# Patient Record
Sex: Male | Born: 1985 | Race: Black or African American | Hispanic: No | Marital: Single | Smoking: Never smoker
Health system: Southern US, Community
[De-identification: ages and names within clinical notes are randomized; demographics above are authoritative.]

## PROBLEM LIST (undated history)

## (undated) DIAGNOSIS — R011 Cardiac murmur, unspecified: Secondary | ICD-10-CM

## (undated) HISTORY — PX: PATENT FORAMEN OVALE CLOSURE: SHX2181

## (undated) HISTORY — PX: APPENDECTOMY: SHX54

---

## 2008-10-26 ENCOUNTER — Emergency Department (HOSPITAL_BASED_OUTPATIENT_CLINIC_OR_DEPARTMENT_OTHER): Admission: EM | Admit: 2008-10-26 | Discharge: 2008-10-26 | Payer: Self-pay | Admitting: Emergency Medicine

## 2008-11-03 ENCOUNTER — Emergency Department (HOSPITAL_BASED_OUTPATIENT_CLINIC_OR_DEPARTMENT_OTHER): Admission: EM | Admit: 2008-11-03 | Discharge: 2008-11-04 | Payer: Self-pay | Admitting: Emergency Medicine

## 2008-11-04 ENCOUNTER — Ambulatory Visit: Payer: Self-pay | Admitting: Diagnostic Radiology

## 2008-11-12 ENCOUNTER — Emergency Department (HOSPITAL_BASED_OUTPATIENT_CLINIC_OR_DEPARTMENT_OTHER): Admission: EM | Admit: 2008-11-12 | Discharge: 2008-11-13 | Payer: Self-pay | Admitting: Emergency Medicine

## 2010-03-06 ENCOUNTER — Emergency Department (HOSPITAL_BASED_OUTPATIENT_CLINIC_OR_DEPARTMENT_OTHER): Admission: EM | Admit: 2010-03-06 | Discharge: 2010-03-06 | Payer: Self-pay | Admitting: Emergency Medicine

## 2010-08-25 LAB — COMPREHENSIVE METABOLIC PANEL
ALT: 18 U/L (ref 0–53)
Alkaline Phosphatase: 135 U/L — ABNORMAL HIGH (ref 39–117)
BUN: 20 mg/dL (ref 6–23)
CO2: 32 mEq/L (ref 19–32)
Calcium: 9.9 mg/dL (ref 8.4–10.5)
GFR calc non Af Amer: >60 mL/min (ref >60)
Glucose, Bld: 98 mg/dL (ref 70–99)
Sodium: 144 mEq/L (ref 135–145)

## 2010-08-25 LAB — LIPASE, BLOOD: Lipase: 83 U/L (ref 23–300)

## 2010-08-25 LAB — CBC
HCT: 49.3 % (ref 39.0–52.0)
Hemoglobin: 16 g/dL (ref 13.0–17.0)
MCHC: 32.5 g/dL (ref 30.0–36.0)
MCV: 87 fL (ref 78.0–100.0)
RBC: 5.67 MIL/uL (ref 4.22–5.81)

## 2010-08-25 LAB — DIFFERENTIAL
Basophils Relative: 1 % (ref 0–1)
Eosinophils Absolute: 0.1 10*3/uL (ref 0.0–0.7)
Lymphs Abs: 2.6 10*3/uL (ref 0.7–4.0)
Neutro Abs: 4.5 10*3/uL (ref 1.7–7.7)
Neutrophils Relative %: 55 % (ref 43–77)

## 2010-08-25 LAB — URINALYSIS, ROUTINE W REFLEX MICROSCOPIC
Bilirubin Urine: NEGATIVE
Glucose, UA: NEGATIVE mg/dL
Hgb urine dipstick: NEGATIVE
Ketones, ur: NEGATIVE mg/dL
Nitrite: NEGATIVE
Specific Gravity, Urine: 1.029 (ref 1.005–1.030)
pH: 7 (ref 5.0–8.0)

## 2010-08-25 LAB — RAPID STREP SCREEN (MED CTR MEBANE ONLY): Streptococcus, Group A Screen (Direct): NEGATIVE

## 2010-10-04 IMAGING — CR DG CHEST 2V
2 series · 2 of 2 positions shown · non-contrast
Comparison: None

CLINICAL DATA: Cough and shortness of breath.

CHEST - 2 VIEW

[w chest pa]
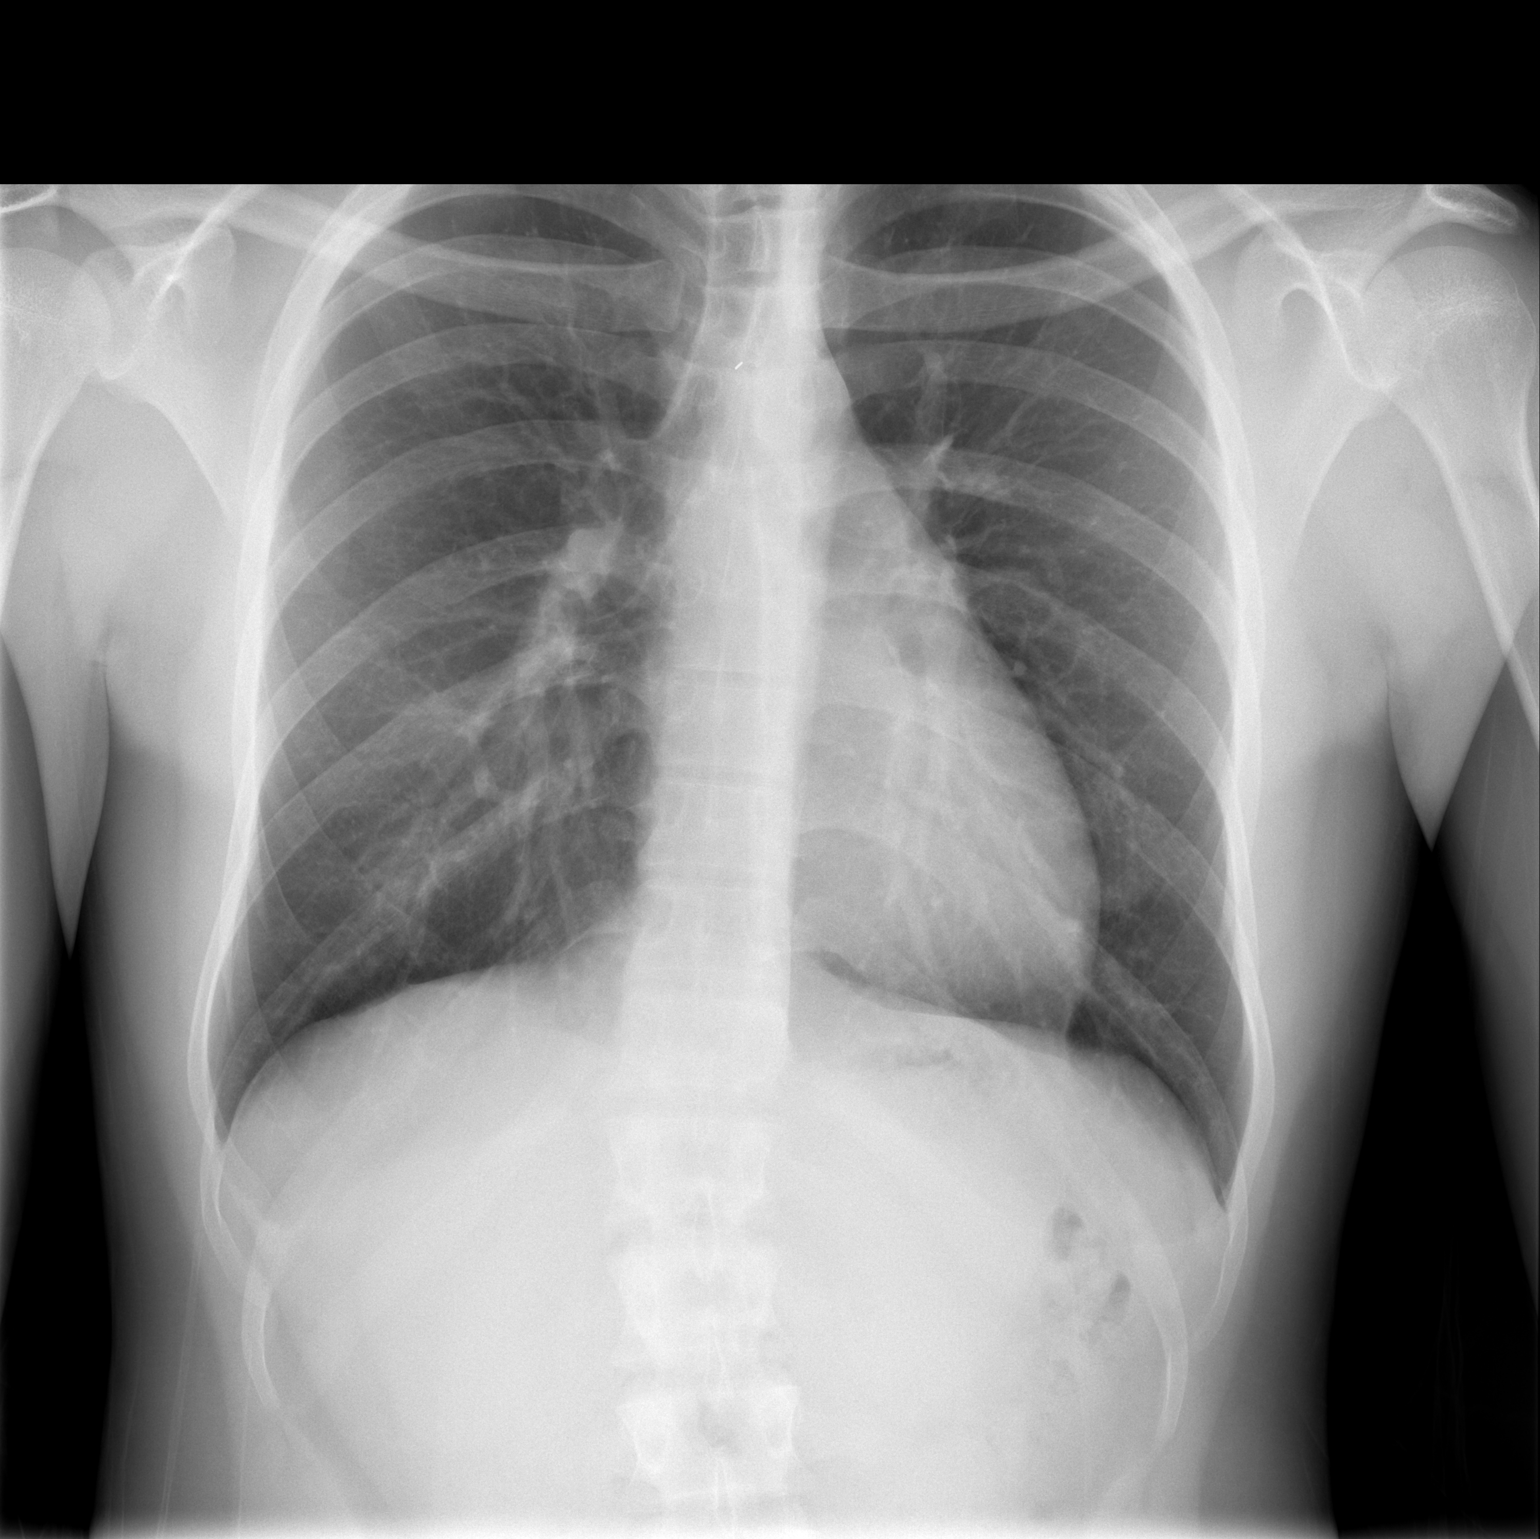

[w chest lat]
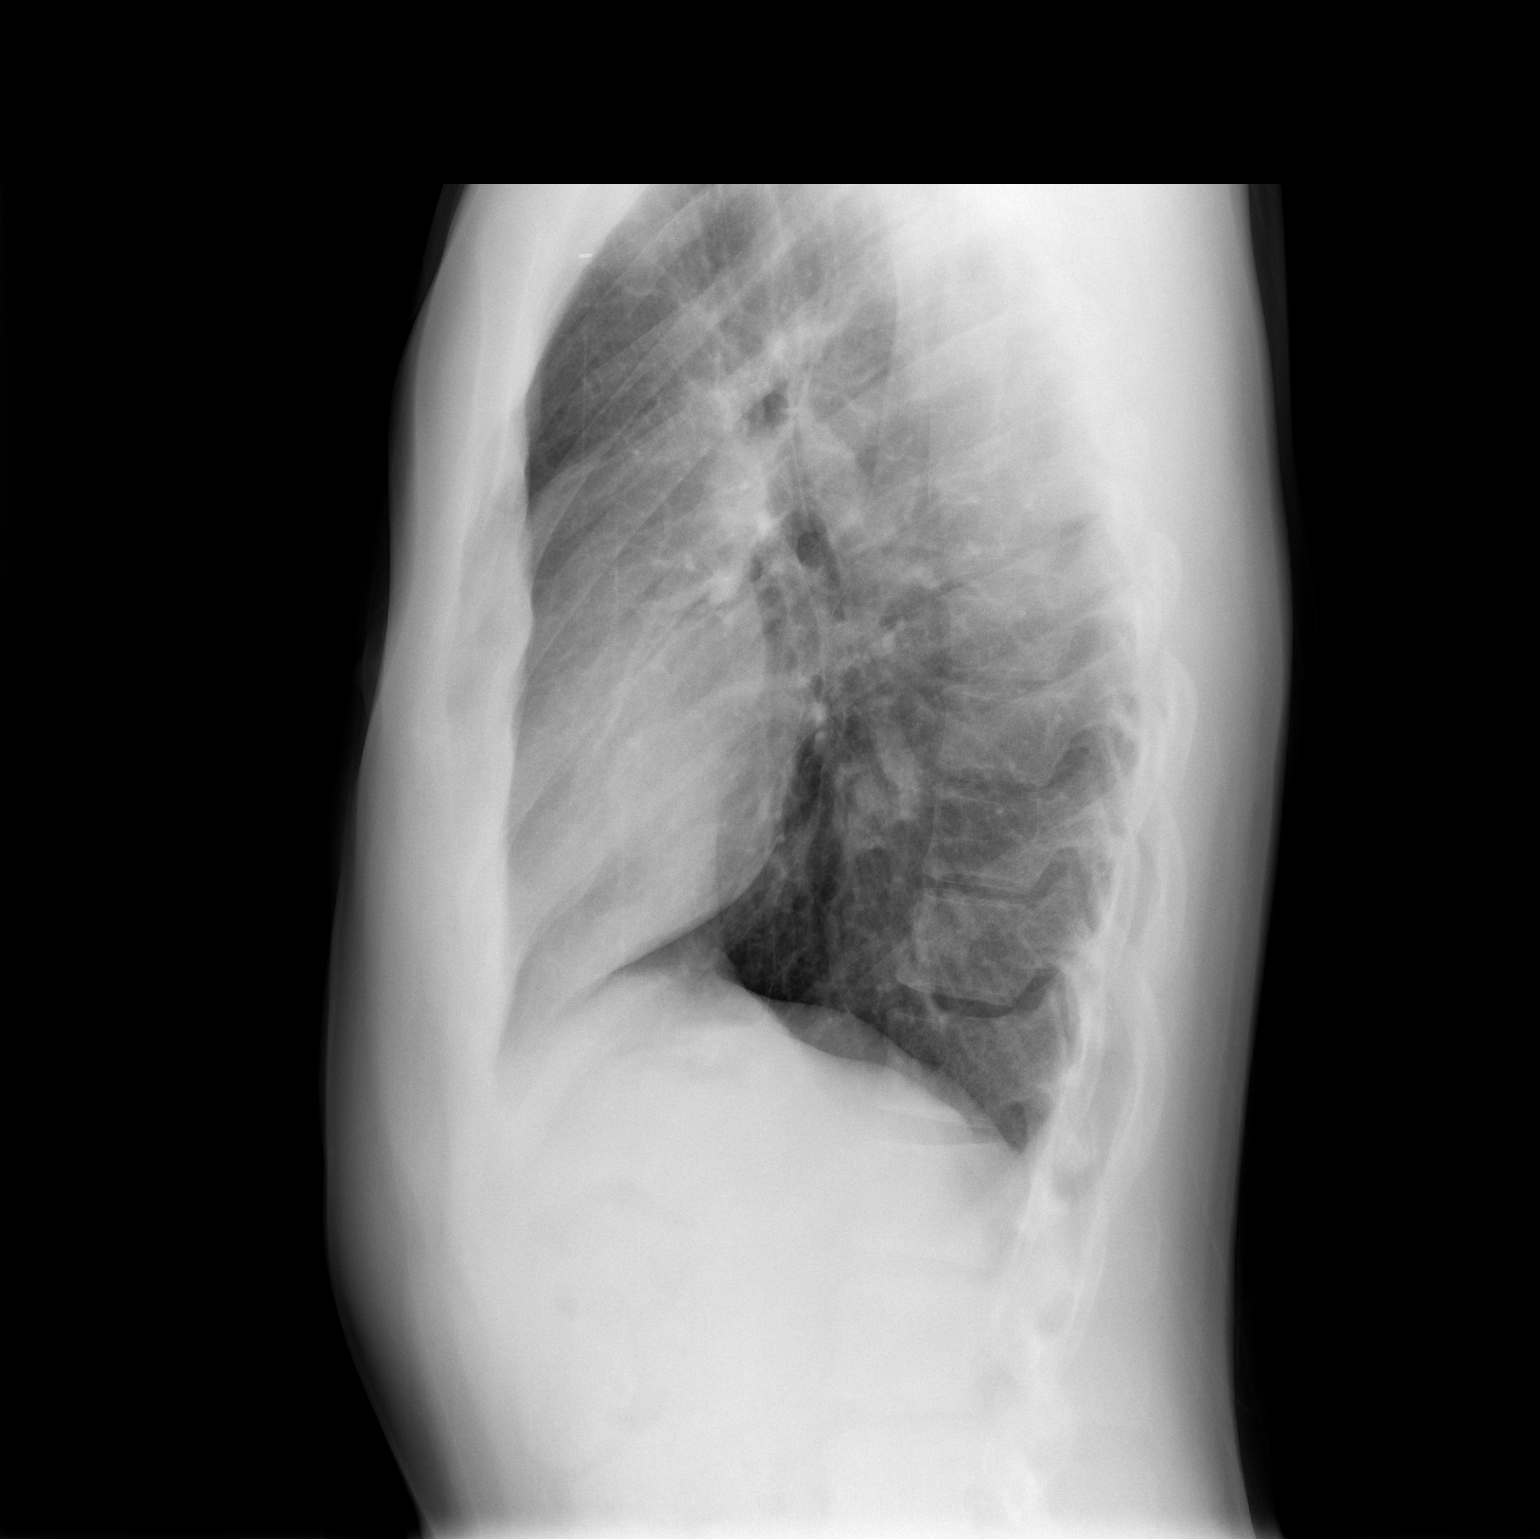

[2 of 2 positions shown; findings below may reference images not displayed]

FINDINGS: The heart size and vascularity are normal and the lungs
are clear.  There is a mild lumbar scoliosis.  There is a tiny
surgical clip seen at the top of the manubrium in the midline.
IMPRESSION: No acute disease.

## 2016-09-23 ENCOUNTER — Emergency Department (HOSPITAL_BASED_OUTPATIENT_CLINIC_OR_DEPARTMENT_OTHER)
Admission: EM | Admit: 2016-09-23 | Discharge: 2016-09-23 | Disposition: A | Discharge status: Home / Self Care | Payer: Self-pay | Attending: Emergency Medicine | Admitting: Emergency Medicine

## 2016-09-23 ENCOUNTER — Encounter (HOSPITAL_BASED_OUTPATIENT_CLINIC_OR_DEPARTMENT_OTHER): Payer: Self-pay

## 2016-09-23 ENCOUNTER — Emergency Department (HOSPITAL_BASED_OUTPATIENT_CLINIC_OR_DEPARTMENT_OTHER): Payer: Self-pay

## 2016-09-23 DIAGNOSIS — R0789 Other chest pain: Secondary | ICD-10-CM | POA: Insufficient documentation

## 2016-09-23 HISTORY — DX: Cardiac murmur, unspecified: R01.1

## 2016-09-23 NOTE — ED Provider Notes (Signed)
[facility] Provider Note   CSN: [id number] Arrival date & time: 09/23/16  1144     History   Chief Complaint Chief Complaint  Patient presents with  . Chest Pain    HPI Patrick Mayo is a 31 y.o. male.  31 yo M with a chief complaint of right-sided chest pain. Worse with movement palpation deep breathing. He denies any overt injury. Denies shortness of breath with it. Denies lower extremity edema hemoptysis testosterone use. Denies history of PE or DVT. Denies recent surgery or prolonged travel. Denies history of smoking. Denies hypertension hyperlipidemia diabetes or family history of heart disease.   The history is provided by the patient.  Chest Pain   This is a new problem. The current episode started more than 2 days ago. The problem occurs rarely. The problem has not changed since onset.The pain is associated with movement. The pain is present in the lateral region. The pain is at a severity of 6/10. The pain is moderate. The quality of the pain is described as brief. The pain does not radiate. Duration of episode(s) is 1 minute. Pertinent negatives include no abdominal pain, no fever, no headaches, no palpitations, no shortness of breath and no vomiting. He has tried nothing for the symptoms. The treatment provided no relief.    Past Medical History:  Diagnosis Date  . Heart murmur     There are no active problems to display for this patient.   Past Surgical History:  Procedure Laterality Date  . APPENDECTOMY         Home Medications    Prior to Admission medications   Not on File    Family History No family history on file.  Social History Social History  Substance Use Topics  . Smoking status: Never Smoker  . Smokeless tobacco: Never Used  . Alcohol use No     Allergies   Patient has no allergy information on record.   Review of Systems Review of Systems  Constitutional: Negative for chills and fever.  HENT: Negative for  congestion and facial swelling.   Eyes: Negative for discharge and visual disturbance.  Respiratory: Negative for shortness of breath.   Cardiovascular: Positive for chest pain. Negative for palpitations.  Gastrointestinal: Negative for abdominal pain, diarrhea and vomiting.  Musculoskeletal: Negative for arthralgias and myalgias.  Skin: Negative for color change and rash.  Neurological: Negative for tremors, syncope and headaches.       Paresthesias  Psychiatric/Behavioral: Negative for confusion and dysphoric mood.     Physical Exam Updated Vital Signs BP 123/65 (BP Location: Left Arm)   Pulse 76   Temp 98.9 F (37.2 C) (Oral)   Resp 18   Ht 6\' 2"  (1.88 m)   Wt 205 lb (93 kg)   SpO2 100%   BMI 26.32 kg/m   Physical Exam  Constitutional: He is oriented to person, place, and time. He appears well-developed and well-nourished.  HENT:  Head: Normocephalic and atraumatic.  Eyes: EOM are normal. Pupils are equal, round, and reactive to light.  Neck: Normal range of motion. Neck supple. No JVD present.  Cardiovascular: Normal rate and regular rhythm.  Exam reveals no gallop and no friction rub.   No murmur heard. Pulmonary/Chest: No respiratory distress. He has no wheezes. He exhibits tenderness (pinpoint chest wall tenderness).  Palpation along the midclavicular line about ribs 6 and 7  Abdominal: He exhibits no distension and no mass. There is no tenderness. There is no rebound and  no guarding.  Musculoskeletal: Normal range of motion.  Clubbing  Neurological: He is alert and oriented to person, place, and time.  Skin: No rash noted. No pallor.  Psychiatric: He has a normal mood and affect. His behavior is normal.  Nursing note and vitals reviewed.    ED Treatments / Results  Labs (all labs ordered are listed, but only abnormal results are displayed) Labs Reviewed - No data to display  EKG  EKG Interpretation  Date/Time:  Wednesday Sep 23 2016 11:56:12  EDT Ventricular Rate:  72 PR Interval:  164 QRS Duration: 94 QT Interval:  392 QTC Calculation: 429 R Axis:   5 Text Interpretation:  Normal sinus rhythm T wave abnormality, consider lateral ischemia Abnormal ECG No old tracing to compare Confirmed by Vauda Salvucci MD, DANIEL [phone]) on 09/23/2016 12:11:40 PM       Radiology Dg Chest 2 View  Result Date: 09/23/2016 CLINICAL DATA:  Chest pain EXAM: CHEST  2 VIEW COMPARISON:  11/04/2008 FINDINGS: Normal heart size and mediastinal contours. Stable clip like density overlapping the central chest. No acute infiltrate or edema. No effusion or pneumothorax. Probable pectus excavatum. No acute osseous finding. IMPRESSION: No evidence of active disease. Electronically Signed   By: Marnee Spring M.D.   On: 09/23/2016 12:12    Procedures Procedures (including critical care time)  Medications Ordered in ED Medications - No data to display   Initial Impression / Assessment and Plan / ED Course  I have reviewed the triage vital signs and the nursing notes.  Pertinent labs & imaging results that were available during my care of the patient were reviewed by me and considered in my medical decision making (see chart for details).     31 yo M With a chief complaint of chest pain. Going on for the past week or so. Reproduced on exam with palpation. Patient is able to point to his pain with one finger. Chest x-ray is unremarkable. Patient has a non-specific EKG. There is no old compare. As the patient's history is so atypical I feel that no further workup is necessary. 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 There is a similar finger change with his brother. I'm unsure if whether this is genetic or not. Suggest a follow-up with family physician.  1:16 PM:  I have discussed the diagnosis/risks/treatment options with the patient and family and believe the pt to be eligible for discharge home to follow-up with PCP. We also  discussed returning to the ED immediately if new or worsening sx occur. We discussed the sx which are most concerning (e.g., sudden worsening pain, fever, inability to tolerate by mouth) that necessitate immediate return. Medications administered to the patient during their visit and any new prescriptions provided to the patient are listed below.  Medications given during this visit Medications - No data to display   The patient appears reasonably screen and/or stabilized for discharge and I doubt any other medical condition or other [facility] requiring further screening, evaluation, or treatment in the ED at this time prior to discharge.    Final Clinical Impressions(s) / ED Diagnoses   Final diagnoses:  Chest wall pain    New Prescriptions New Prescriptions   No medications on file     Melene Plan, DO 09/23/16 1316

## 2016-09-23 NOTE — Discharge Instructions (Signed)
Take 4 over the counter ibuprofen tablets 3 times a day or 2 over-the-counter naproxen tablets twice a day for pain. Also take tylenol 1000mg(2 extra strength) four times a day.    

## 2016-09-23 NOTE — ED Triage Notes (Signed)
c/o intermittent CP x 1-2 weeks-NAD-steady gait

## 2016-09-23 NOTE — ED Notes (Signed)
ED Provider at bedside. 

## 2017-02-01 ENCOUNTER — Emergency Department (HOSPITAL_BASED_OUTPATIENT_CLINIC_OR_DEPARTMENT_OTHER): Payer: Self-pay

## 2017-02-01 ENCOUNTER — Emergency Department (HOSPITAL_BASED_OUTPATIENT_CLINIC_OR_DEPARTMENT_OTHER)
Admission: EM | Admit: 2017-02-01 | Discharge: 2017-02-01 | Disposition: A | Discharge status: Home / Self Care | Payer: Self-pay | Attending: Emergency Medicine | Admitting: Emergency Medicine

## 2017-02-01 ENCOUNTER — Encounter (HOSPITAL_BASED_OUTPATIENT_CLINIC_OR_DEPARTMENT_OTHER): Payer: Self-pay | Admitting: Emergency Medicine

## 2017-02-01 DIAGNOSIS — Z91010 Allergy to peanuts: Secondary | ICD-10-CM | POA: Insufficient documentation

## 2017-02-01 DIAGNOSIS — R079 Chest pain, unspecified: Secondary | ICD-10-CM | POA: Insufficient documentation

## 2017-02-01 DIAGNOSIS — R7989 Other specified abnormal findings of blood chemistry: Secondary | ICD-10-CM | POA: Insufficient documentation

## 2017-02-01 LAB — BASIC METABOLIC PANEL
Anion gap: 7 (ref 5–15)
BUN: 11 mg/dL (ref 6–20)
CALCIUM: 9.6 mg/dL (ref 8.9–10.3)
CHLORIDE: 102 mmol/L (ref 101–111)
CO2: 29 mmol/L (ref 22–32)
CREATININE: 1.43 mg/dL — AB (ref 0.61–1.24)
GFR calc Af Amer: >60 mL/min (ref >60)
GFR calc non Af Amer: >60 mL/min (ref >60)
Glucose, Bld: 86 mg/dL (ref 65–99)
Potassium: 3.8 mmol/L (ref 3.5–5.1)
SODIUM: 138 mmol/L (ref 135–145)

## 2017-02-01 LAB — D-DIMER, QUANTITATIVE (NOT AT ARMC)

## 2017-02-01 LAB — CBC WITH DIFFERENTIAL/PLATELET
Basophils Absolute: 0 10*3/uL (ref 0.0–0.1)
Basophils Relative: 1 %
EOS ABS: 0.4 10*3/uL (ref 0.0–0.7)
EOS PCT: 5 %
HCT: 42.6 % (ref 39.0–52.0)
Hemoglobin: 14.6 g/dL (ref 13.0–17.0)
LYMPHS ABS: 2.7 10*3/uL (ref 0.7–4.0)
Lymphocytes Relative: 31 %
MCH: 29.1 pg (ref 26.0–34.0)
MCHC: 34.3 g/dL (ref 30.0–36.0)
MCV: 85 fL (ref 78.0–100.0)
MONOS PCT: 9 %
Monocytes Absolute: 0.8 10*3/uL (ref 0.1–1.0)
Neutro Abs: 4.6 10*3/uL (ref 1.7–7.7)
Neutrophils Relative %: 54 %
PLATELETS: 234 10*3/uL (ref 150–400)
RBC: 5.01 MIL/uL (ref 4.22–5.81)
RDW: 13.5 % (ref 11.5–15.5)
WBC: 8.5 10*3/uL (ref 4.0–10.5)

## 2017-02-01 MED ORDER — ACETAMINOPHEN ER 650 MG PO TBCR: 650.0000 mg | EXTENDED_RELEASE_TABLET | Freq: Three times a day (TID) | ORAL | 0 refills | Status: AC | PRN

## 2017-02-01 NOTE — ED Triage Notes (Addendum)
Patient reports intermittent stabbing chest pain that began last night.  Denies nausea, shortness of breath.  Reports the pain radiates to his right side.  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  Reports no issues with this since.

## 2017-02-01 NOTE — ED Notes (Signed)
Attempted IV start x1 without success. 

## 2017-02-01 NOTE — ED Provider Notes (Signed)
[facility] Provider Note   CSN: [id number] Arrival date & time: 02/01/17  1019     History   Chief Complaint Chief Complaint  Patient presents with  . Chest Pain    HPI Patrick Mayo is a 31 y.o. male.  HPI Pt with hx of heart murmur comes in with cc of chest pain. Pt reports that his pain is R sided, intermittent, stabbing type pain that is worse with inspiration. Pt denies any hx of similar pain. Pt has no associated n/v/f/c/dib/cough. Pt has no hx of PE, DVT and denies any exogenous hormone (testosterone / estrogen) use, long distance travels or surgery in the past 6 weeks, active cancer, recent immobilization. 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  Past Medical History:  Diagnosis Date  . Heart murmur     There are no active problems to display for this patient.   Past Surgical History:  Procedure Laterality Date  . APPENDECTOMY    . PATENT FORAMEN OVALE CLOSURE         Home Medications    Prior to Admission medications   Medication Sig Start Date End Date Taking? Authorizing Provider  acetaminophen (TYLENOL 8 HOUR) 650 MG CR tablet Take 1 tablet (650 mg total) by mouth every 8 (eight) hours as needed for pain. 02/01/17   Derwood Kaplan, MD    Family History History reviewed. No pertinent family history.  Social History Social History  Substance Use Topics  . Smoking status: Never Smoker  . Smokeless tobacco: Never Used  . Alcohol use No     Allergies   Peanut-containing drug products and Syrup nf [simple syrup]   Review of Systems Review of Systems  Constitutional: Negative for activity change.  Respiratory: Negative for stridor.   Cardiovascular: Positive for chest pain. Negative for palpitations.  Hematological: Does not bruise/bleed easily.     Physical Exam Updated Vital Signs BP 112/78 (BP Location: Right Arm)   Pulse 76   Temp 99.1 F (37.3 C) (Oral)   Resp 18    Ht  (1.88 m)   Wt 93.6 kg (206 lb 5.6 oz)   SpO2 100%   BMI 26.49 kg/m   Physical Exam  Constitutional: He is oriented to person, place, and time. He appears well-developed.  HENT:  Head: Atraumatic.  Neck: Neck supple.  Cardiovascular: Normal rate.   Pulmonary/Chest: Effort normal.  Abdominal: Soft.  Neurological: He is alert and oriented to person, place, and time.  Skin: Skin is warm.  Nursing note and vitals reviewed.    ED Treatments / Results  Labs (all labs ordered are listed, but only abnormal results are displayed) Labs Reviewed  BASIC METABOLIC PANEL - Abnormal; Notable for the following:       Result Value   Creatinine, Ser 1.43 (*)    All other components within normal limits  CBC WITH DIFFERENTIAL/PLATELET  D-DIMER, QUANTITATIVE (NOT AT [facility])    EKG  EKG Interpretation  Date/Time:  Monday February 01 2017 10:26:09 EDT Ventricular Rate:  79 PR Interval:    QRS Duration: 92 QT Interval:  351 QTC Calculation: 403 R Axis:   -37 Text Interpretation:  Sinus rhythm Probable left atrial enlargement RSR' in V1 or V2, probably normal variant Left ventricular hypertrophy [id number] is not new No significant change since last tracing Confirmed by Derwood Kaplan [phone]) on 02/01/2017 10:33:52 AM       Radiology Dg Chest  2 View  Result Date: 02/01/2017 CLINICAL DATA:  Initial evaluation for acute right-sided chest pain, nausea. EXAM: CHEST  2 VIEW COMPARISON:  Prior radiograph from 09/23/2016. FINDINGS: The cardiac and mediastinal silhouettes are stable in size and contour, and remain within normal limits. Subcentimeter metallic clip overlying the upper mediastinum noted, stable. The lungs are normally inflated. No airspace consolidation, pleural effusion, or pulmonary edema is identified. There is no pneumothorax. No acute osseous abnormality identified. IMPRESSION: No active cardiopulmonary disease. Electronically Signed   By: Rise Mu M.D.   On:  02/01/2017 13:28    Procedures Procedures (including critical care time)  Medications Ordered in ED Medications - No data to display   Initial Impression / Assessment and Plan / ED Course  I have reviewed the triage vital signs and the nursing notes.  Pertinent labs & imaging results that were available during my care of the patient were reviewed by me and considered in my medical decision making (see chart for details).  Clinical Course as of Feb 01 1442  Mon Feb 01, 2017  1443 Results from the ER workup discussed with the patient face to face and all questions answered to the best of my ability.  Creatinine: (!) 1.43 [AN]    Clinical Course User Index [AN] Derwood Kaplan, MD   Pt with pleuritic chest pain. Pt has R sided strain on ekg  - could be due to the congenital heart disease,could be PE. Pt is PERC neg otherwise. We discussed the pros and cons of aggressive ER workup vs. Conservative outpatient workup (or ED return if symptoms get worse)- and pt prefers having aggressive workup done for now.  Final Clinical Impressions(s) / ED Diagnoses   Final diagnoses:  Nonspecific chest pain  Elevated serum creatinine    New Prescriptions Discharge Medication List as of 02/01/2017  1:56 PM    START taking these medications   Details  acetaminophen (TYLENOL 8 HOUR) 650 MG CR tablet Take 1 tablet (650 mg total) by mouth every 8 (eight) hours as needed for pain., Starting Mon 02/01/2017, Print         Derwood Kaplan, MD 02/01/17 [phone]

## 2017-02-01 NOTE — Discharge Instructions (Signed)
We saw you in the ER for the chest pain/shortness of breath. All of our cardiac workup is normal, including labs, EKG and chest X-RAY are normal. WE DID NOTICE SLIGHT ELEVATION IN YOUR KIDNEY ENZYME - drink plenty of fluids and get the kidney enzyme rechecked. We are not sure what is causing your discomfort, but we feel comfortable sending you home at this time. The workup in the ER is not complete, and you should follow up with your primary care doctor for further evaluation.  Please return to the ER if you have worsening chest pain, shortness of breath, pain radiating to your jaw, shoulder, or back, sweats or fainting. Otherwise see the Cardiologist or your primary care doctor as requested.

## 2017-03-09 DIAGNOSIS — Z91010 Allergy to peanuts: Secondary | ICD-10-CM | POA: Insufficient documentation

## 2017-03-09 DIAGNOSIS — T7840XA Allergy, unspecified, initial encounter: Secondary | ICD-10-CM | POA: Insufficient documentation

## 2017-03-10 ENCOUNTER — Encounter (HOSPITAL_BASED_OUTPATIENT_CLINIC_OR_DEPARTMENT_OTHER): Payer: Self-pay | Admitting: Emergency Medicine

## 2017-03-10 ENCOUNTER — Emergency Department (HOSPITAL_BASED_OUTPATIENT_CLINIC_OR_DEPARTMENT_OTHER)
Admission: EM | Admit: 2017-03-10 | Discharge: 2017-03-10 | Disposition: A | Discharge status: Home / Self Care | Payer: Self-pay | Attending: Emergency Medicine | Admitting: Emergency Medicine

## 2017-03-10 DIAGNOSIS — T7840XA Allergy, unspecified, initial encounter: Secondary | ICD-10-CM

## 2017-03-10 MED ORDER — FAMOTIDINE IN NACL 20-0.9 MG/50ML-% IV SOLN
20.0000 mg | Freq: Once | INTRAVENOUS | Status: AC
  Administered 2017-03-10: 20 mg via INTRAVENOUS
  Filled 2017-03-10: qty 50

## 2017-03-10 MED ORDER — ONDANSETRON HCL 4 MG/2ML IJ SOLN
4.0000 mg | Freq: Once | INTRAMUSCULAR | Status: AC
  Administered 2017-03-10: 4 mg via INTRAVENOUS
  Filled 2017-03-10: qty 2

## 2017-03-10 MED ORDER — PREDNISONE 20 MG PO TABS: 60.0000 mg | ORAL_TABLET | Freq: Every day | ORAL | 0 refills | Status: DC

## 2017-03-10 MED ORDER — DIPHENHYDRAMINE HCL 50 MG/ML IJ SOLN
50.0000 mg | Freq: Once | INTRAMUSCULAR | Status: AC
  Administered 2017-03-10: 50 mg via INTRAVENOUS
  Filled 2017-03-10: qty 1

## 2017-03-10 MED ORDER — ONDANSETRON 4 MG PO TBDP: 4.0000 mg | ORAL_TABLET | Freq: Four times a day (QID) | ORAL | 0 refills | Status: DC | PRN

## 2017-03-10 MED ORDER — METHYLPREDNISOLONE SODIUM SUCC 125 MG IJ SOLR
125.0000 mg | Freq: Once | INTRAMUSCULAR | Status: AC
  Administered 2017-03-10: 125 mg via INTRAVENOUS
  Filled 2017-03-10: qty 2

## 2017-03-10 MED ORDER — FAMOTIDINE 20 MG PO TABS: 20.0000 mg | ORAL_TABLET | Freq: Two times a day (BID) | ORAL | 0 refills | Status: DC

## 2017-03-10 NOTE — ED Provider Notes (Signed)
TIME SEEN: 12:24 AM  CHIEF COMPLAINT: "I think I am having an allergic reaction"  HPI: Pt his a 31 year old male with history of previous "open heart surgery" for a "hole in my heart" as an infant who presents to the emergency department with complaints of feeling like he is having allergic reaction. States he has been having symptoms like this intermittently for the past several months. 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 States that he ate an oatmeal cookie on Friday, October 19 and afterwards he began vomiting and felt itchy. Reports he has seen intermittent hives have improved with Aveeno.  He will also reports that he feels like his uvula is swollen. No lip or tongue swelling. No difficulty breathing or wheezing. No abdominal pain or diarrhea. No other new soaps, lotions, detergents or medications.  ROS: See HPI Constitutional: no fever  Eyes: no drainage  ENT: no runny nose   Cardiovascular:  no chest pain  Resp: no SOB  GI:  vomiting GU: no dysuria Integumentary: intermittent hives Allergy: no hives  Musculoskeletal: no leg swelling  Neurological: no slurred speech ROS otherwise negative  PAST MEDICAL HISTORY/PAST SURGICAL HISTORY:  Past Medical History:  Diagnosis Date  . Heart murmur     MEDICATIONS:  Prior to Admission medications   Medication Sig Start Date End Date Taking? Authorizing Provider  diphenhydrAMINE (BENADRYL) 25 MG tablet Take 25 mg by mouth as needed.   Yes [provider]  acetaminophen (TYLENOL 8 HOUR) 650 MG CR tablet Take 1 tablet (650 mg total) by mouth every 8 (eight) hours as needed for pain. 02/01/17   Derwood Kaplan, MD    ALLERGIES:  Allergies  Allergen Reactions  . Peanut-Containing Drug Products Shortness Of Breath  . Syrup Nf [Simple Syrup] Shortness Of Breath    SOCIAL HISTORY:  Social History  Substance Use Topics  . Smoking status: Never Smoker  . Smokeless tobacco: Never Used  . Alcohol use No     FAMILY HISTORY: No family history on file.  EXAM: BP 130/89   Pulse (!) 108   Temp 99 F (37.2 C) (Oral)   Resp 18   Ht 6' 2.5" (1.892 m)   Wt 95 kg (209 lb 7 oz)   SpO2 100%   BMI 26.53 kg/m  CONSTITUTIONAL: Alert and oriented and responds appropriately to questions. Well-appearing; well-nourished HEAD: Normocephalic EYES: Conjunctivae clear, pupils appear equal, EOMI ENT: normal nose; moist mucous membranes; No pharyngeal erythema or petechiae, no tonsillar hypertrophy or exudate, no uvular deviation, no unilateral swelling, no trismus or drooling, no muffled voice, normal phonation, no stridor, no dental caries present, no drainable dental abscess noted, no Ludwig's angina, tongue sits flat in the bottom of the mouth, no angioedema, no facial erythema or warmth, no facial swelling; no pain with movement of the neck.  I do not appreciate any significant uvulitis. NECK: Supple, no meningismus, no nuchal rigidity, no LAD  CARD: RRR; S1 and S2 appreciated; no murmurs, no clicks, no rubs, no gallops RESP: Normal chest excursion without splinting or tachypnea; breath sounds clear and equal bilaterally; no wheezes, no rhonchi, no rales, no hypoxia or respiratory distress, speaking full sentences ABD/GI: Normal bowel sounds; non-distended; soft, non-tender, no rebound, no guarding, no peritoneal signs, no hepatosplenomegaly BACK:  The back appears normal and is non-tender to palpation, there is no CVA tenderness EXT: Normal ROM in all joints; non-tender to palpation; no edema; normal capillary refill; no cyanosis, no calf tenderness or swelling  SKIN: Normal color for age and race; warm; no rash, no urticaria NEURO: Moves all extremities equally PSYCH: The patient's mood and manner are appropriate. Grooming and personal hygiene are appropriate.  MEDICAL DECISION MAKING: Pt here with symptoms that he claims are similar to his previous allergic reactions. Reports intermittent hives,  uvulitis, vomiting. No angioedema, swelling of the uvula at this time. Normal phonation. No stridor, trismus or drooling. No wheezing or hypoxia. No hypotension. No hives. He states that this is similar to his previous allergic reactions and that IV medications have helped him previously. We'll give IV Benadryl, Solu-Medrol, Pepcid and Zofran and reassess.  ED PROGRESS: 2:10 AM  Pt reports feeling better. I still do not appreciate any hives, angioedema, significant uvulitis, wheezing. Still hemodynamically stable. We'll discharge home with steroid burst, Zofran, Pepcid. Recommend continuing over-the-counter Benadryl. Will give allergy outpatient follow-up. Will give PCP outpatient follow-up. His twin brother is here to drive him home. Discussed return precautions.   At this time, I do not feel there is any life-threatening condition present. I have reviewed and discussed all results (EKG, imaging, lab, urine as appropriate) and exam findings with patient/family. I have reviewed nursing notes and appropriate previous records.  I feel the patient is safe to be discharged home without further emergent workup and can continue workup as an outpatient as needed. Discussed usual and customary return precautions. Patient/family verbalize understanding and are comfortable with this plan.  Outpatient follow-up has been provided if needed. All questions have been answered.      Regina Ganci, Layla MawKristen N, DO 03/10/17 0214

## 2017-03-10 NOTE — ED Notes (Signed)
Pt given water 

## 2017-03-10 NOTE — Discharge Instructions (Signed)
Please continue Benadryl 25-50 mg every 8 hours as needed for itching, hives.   To find a primary care or specialty doctor please call [phone] or [phone] to access "[facility] Find a Doctor Service."  You may also go on the [facility] website at [email or web address]  There are also multiple [facility], [facility], [facility] and [facility] practices throughout the [place] that are frequently accepting new patients. You may find a clinic that is close to your home and contact them.  [facility] -  [address] [place] [phone]   [facility] -  [address] [place] [id number] [phone]   [facility] [phone]  [place] [phone]

## 2017-03-10 NOTE — ED Triage Notes (Signed)
Pt reports "having an allergic reaction" after eating an oatmeal cookie x4 days ago. Pt c/o itching and vomiting after eating. Pt states benadryl isn't helping.

## 2017-03-10 NOTE — ED Notes (Signed)
ED Provider at bedside. 

## 2017-10-29 ENCOUNTER — Other Ambulatory Visit: Payer: Self-pay

## 2017-10-29 ENCOUNTER — Emergency Department (HOSPITAL_BASED_OUTPATIENT_CLINIC_OR_DEPARTMENT_OTHER)
Admission: EM | Admit: 2017-10-29 | Discharge: 2017-10-29 | Disposition: A | Discharge status: Home / Self Care | Payer: Self-pay | Attending: Emergency Medicine | Admitting: Emergency Medicine

## 2017-10-29 ENCOUNTER — Encounter (HOSPITAL_BASED_OUTPATIENT_CLINIC_OR_DEPARTMENT_OTHER): Payer: Self-pay | Admitting: Adult Health

## 2017-10-29 DIAGNOSIS — J020 Streptococcal pharyngitis: Secondary | ICD-10-CM | POA: Insufficient documentation

## 2017-10-29 LAB — RAPID STREP SCREEN (MED CTR MEBANE ONLY): Streptococcus, Group A Screen (Direct): POSITIVE — AB

## 2017-10-29 MED ORDER — FAMOTIDINE 20 MG PO TABS
20.0000 mg | ORAL_TABLET | Freq: Once | ORAL | Status: AC
  Administered 2017-10-29: 20 mg via ORAL
  Filled 2017-10-29: qty 1

## 2017-10-29 MED ORDER — PENICILLIN G BENZATHINE 1200000 UNIT/2ML IM SUSP
1.2000 10*6.[IU] | Freq: Once | INTRAMUSCULAR | Status: AC
  Administered 2017-10-29: 1.2 10*6.[IU] via INTRAMUSCULAR
  Filled 2017-10-29: qty 2

## 2017-10-29 MED ORDER — PREDNISONE 50 MG PO TABS
60.0000 mg | ORAL_TABLET | Freq: Once | ORAL | Status: AC
  Administered 2017-10-29: 60 mg via ORAL
  Filled 2017-10-29: qty 1

## 2017-10-29 MED ORDER — DIPHENHYDRAMINE HCL 25 MG PO CAPS
50.0000 mg | ORAL_CAPSULE | Freq: Once | ORAL | Status: AC
  Administered 2017-10-29: 50 mg via ORAL
  Filled 2017-10-29: qty 2

## 2017-10-29 NOTE — ED Provider Notes (Signed)
[facility] EMERGENCY DEPARTMENT Provider Note   CSN: [id number] Arrival date & time: 10/29/17  1532     History   Chief Complaint Chief Complaint  Patient presents with  . Allergic Reaction    HPI Patrick Mayo is a 32 y.o. male.  HPI   Pt is a 32 y/o male with a h/o heart murmur who presents to the ED today with his mother to be evaluated for an allergic reaction.  Patient states that he has been feeling a lump in his throat for the last 4 days after eating an oatmeal cookie.  Symptoms are intermittent and are worse when he is at work.  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  He is around a lot of dust when he is at work.  He also reports that scratching sensation to his throat and feels like his uvula is swollen.  Denies any difficulty drinking liquids or eating solids at home.  Has mild pain to the right throat but it is not severe.  Denies being around any sick contacts recently.  No rhinorrhea, congestion, ear pain, fevers, cough, wheezing, shortness of breath.  No swelling to the lips or tongue.  Denies any rashes.  Denies any history of anaphylaxis.  States he has never had to use an EpiPen.  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 Denies that he has recently eaten any peanuts or others foods that he is allergic to.  States that when he has an allergic reaction to peanuts in the past same thing happens where he feels like he has a swollen uvula and a scratchy throat.  He is tried taking children's Allegra a few times this week with temporary relief of his symptoms.  Denies any other new medications, soaps, detergents or foods.  On review of prior records patient was seen for similar symptoms on 03/10/2017.  At that time he was also complaining of uvular swelling.  He also had some hives at that time which improved with Aveeno.  No lip or  tongue swelling noted during that evaluation.  Past Medical History:  Diagnosis Date  . Heart murmur     There are no active problems to display for this patient.   Past Surgical History:  Procedure Laterality Date  . APPENDECTOMY    . PATENT FORAMEN OVALE CLOSURE       Home Medications    Prior to Admission medications   Medication Sig Start Date End Date Taking? Authorizing Provider  acetaminophen (TYLENOL 8 HOUR) 650 MG CR tablet Take 1 tablet (650 mg total) by mouth every 8 (eight) hours as needed for pain. 02/01/17   Derwood Kaplan, MD  diphenhydrAMINE (BENADRYL) 25 MG tablet Take 25 mg by mouth as needed.    [provider]  famotidine (PEPCID) 20 MG tablet Take 1 tablet (20 mg total) by mouth 2 (two) times daily. 03/10/17   Ward, Layla Maw, DO  ondansetron (ZOFRAN ODT) 4 MG disintegrating tablet Take 1 tablet (4 mg total) by mouth every 6 (six) hours as needed for nausea or vomiting. 03/10/17   Ward, Layla Maw, DO  predniSONE (DELTASONE) 20 MG tablet Take 3 tablets (60 mg total) by mouth daily. 03/10/17   Ward, Layla Maw, DO    Family History History reviewed. No pertinent family history.  Social History Social History  Tobacco Use  . Smoking status: Never Smoker  . Smokeless tobacco: Never Used  Substance Use Topics  . Alcohol use: No  . Drug use: No    Comment: quit marijuana use 8 mos ago     Allergies   Peanut-containing drug products and Syrup nf [simple syrup]   Review of Systems Review of Systems  Constitutional: Negative for fever.  HENT: Positive for sore throat. Negative for congestion, ear pain, rhinorrhea, sneezing and trouble swallowing.        "feels like a lump is in my throat"  Eyes: Negative for visual disturbance.  Respiratory: Negative for cough, shortness of breath and wheezing.   Cardiovascular: Negative for chest pain.  Gastrointestinal: Negative for abdominal pain, constipation, diarrhea, nausea and vomiting.    Genitourinary: Negative for flank pain.  Musculoskeletal: Negative for back pain.  Skin: Negative for rash.  Neurological: Negative for headaches.     Physical Exam Updated Vital Signs BP 123/71 (BP Location: Left Arm)   Pulse 87   Temp 98.8 F (37.1 C) (Oral)   Resp 18   Ht 6\' 1"  (1.854 m)   Wt 87.1 kg (192 lb)   SpO2 99%   BMI 25.33 kg/m   Physical Exam  Constitutional: He is oriented to person, place, and time. He appears well-developed and well-nourished.  HENT:  Head: Normocephalic and atraumatic.  Bilateral TMs without erythema or effusion.  Mild pharyngeal erythema.  No tonsillar swelling or exudates.  Uvula does not appear to be swollen.  Uvula is midline.  Patient has a normal voice and is tolerating secretions with a patent airway.  No evidence of angioedema on exam. No swelling beneath the tongue. No TTP to the teeth or to the face. No facial or neck swelling.  Eyes: Pupils are equal, round, and reactive to light. Conjunctivae and EOM are normal.  Neck: Normal range of motion. Neck supple.  Cardiovascular: Normal rate, regular rhythm, normal heart sounds and intact distal pulses.  No murmur heard. Pulmonary/Chest: Effort normal and breath sounds normal. No respiratory distress. He has no wheezes.  Abdominal: Soft. Bowel sounds are normal. He exhibits no distension. There is no tenderness. There is no guarding.  Musculoskeletal: He exhibits no edema.  Lymphadenopathy:    He has cervical adenopathy.  Neurological: He is alert and oriented to person, place, and time.  Skin: Skin is warm and dry. Capillary refill takes less than 2 seconds.  Psychiatric: He has a normal mood and affect.  Nursing note and vitals reviewed.  ED Treatments / Results  Labs (all labs ordered are listed, but only abnormal results are displayed) Labs Reviewed  RAPID STREP SCREEN ([facility] ONLY) - Abnormal; Notable for the following components:      Result Value   Streptococcus, Group A  Screen (Direct) POSITIVE (*)    All other components within normal limits  CULTURE, GROUP A STREP [facility])    EKG None  Radiology No results found.  Procedures Procedures (including critical care time)  Medications Ordered in ED Medications  penicillin g benzathine (BICILLIN LA) 1200000 UNIT/2ML injection 1.2 Million Units (has no administration in time range)  diphenhydrAMINE (BENADRYL) capsule 50 mg (50 mg Oral Given 10/29/17 1634)  predniSONE (DELTASONE) tablet 60 mg (60 mg Oral Given 10/29/17 1634)  famotidine (PEPCID) tablet 20 mg (20 mg Oral Given 10/29/17 1634)     Initial Impression / Assessment and Plan / ED Course  I have reviewed the triage vital signs and the  nursing notes.  Pertinent labs & imaging results that were available during my care of the patient were reviewed by me and considered in my medical decision making (see chart for details).     Final Clinical Impressions(s) / ED Diagnoses   Final diagnoses:  Strep pharyngitis   Patient presenting reporting that he has had an allergic reaction.  Reports that he feels like his uvula swollen.  Has been seen in the ED prior to this for similar symptoms and was treated with Pepcid, Benadryl and Solu-Medrol.  Denies a history of anaphylaxis and denies ever having to use an EpiPen for allergic reaction.  No angioedema or uvular swelling noted on exam today.  No stridor, trismus or drooling.  No dental pain no pain to the palpation of his face.  No wheezing.  Normal voice.  Patient will be given Pepcid, Benadryl and steroids.  Will also check a strep test given his pharyngeal erythema. Strep test positive. Suspect that this is the cause of pts sxs rather than an allergic reaction. No evidence of PTA, retropharyngeal abscess, or ludwigs angina on exam. Patient re-evaluated prior to dc, is hemodynamically stable, in no respiratory distress, and denies the feeling of throat closing. States he feels tired from benadryl but otherwise  has had no changes in sxs. Discussed results of strep tests and pt prefers to be tx'ed with IM PCN. Advised him to f/u with him PCP in 1 week and to return to the ED if any new or worsening sxs develop. Pt is agreeable with plan & verbalizes understanding. All questions answered.  ED Discharge Orders    None       Rayne DuCouture, Jenin Birdsall S, PA-C 10/29/17 1716    Linwood DibblesKnapp, Jon, MD 10/30/17 1510

## 2017-10-29 NOTE — Discharge Instructions (Signed)
You may rotate tylenol and motrin for your sore throat.  Over the next several days you should rest as much as possible, and drink more fluids than usual.   To help soothe a sore throat gargle with warm salt water.  Make salt water by dissolving  teaspoon salt in 1 cup warm water. You may also use throat lozenges and over the counter sore throat spray.  Please follow up with your primary care provider within 5-7 days for re-evaluation of your symptoms. If you do not have a primary care provider, information for a healthcare clinic has been provided for you to make arrangements for follow up care. Please return to the emergency department for any persistent fevers, worsening sore throat/hoarse voice, inability to swallow, persistent vomiting, chest pain, shortness of breath, coughing up blood, or any new or worsening symptoms.

## 2017-10-29 NOTE — ED Triage Notes (Signed)
Pt states that he has been feeling a lump in his throat since he has been working "up in the air in a [employer]" The feeling began this week and has been off and on. When he is home he is fine. He states "I am allergic to peanuts, but I have been eating orange cake and coconut cakes and I don't know if that is causing it"  HE is taking children's allegra 3x this week. Bilateral breath sounds clear. HE has a raspy voice and reports that is normal for him at times. He denies rashes, wheezing and nausea.

## 2019-01-01 IMAGING — CR DG CHEST 2V
2 series · 2 of 2 positions shown · non-contrast
Comparison: Prior radiograph from 09/23/2016.

CLINICAL DATA: Initial evaluation for acute right-sided chest pain,
nausea.

EXAM:
CHEST  2 VIEW

[w chest pa]
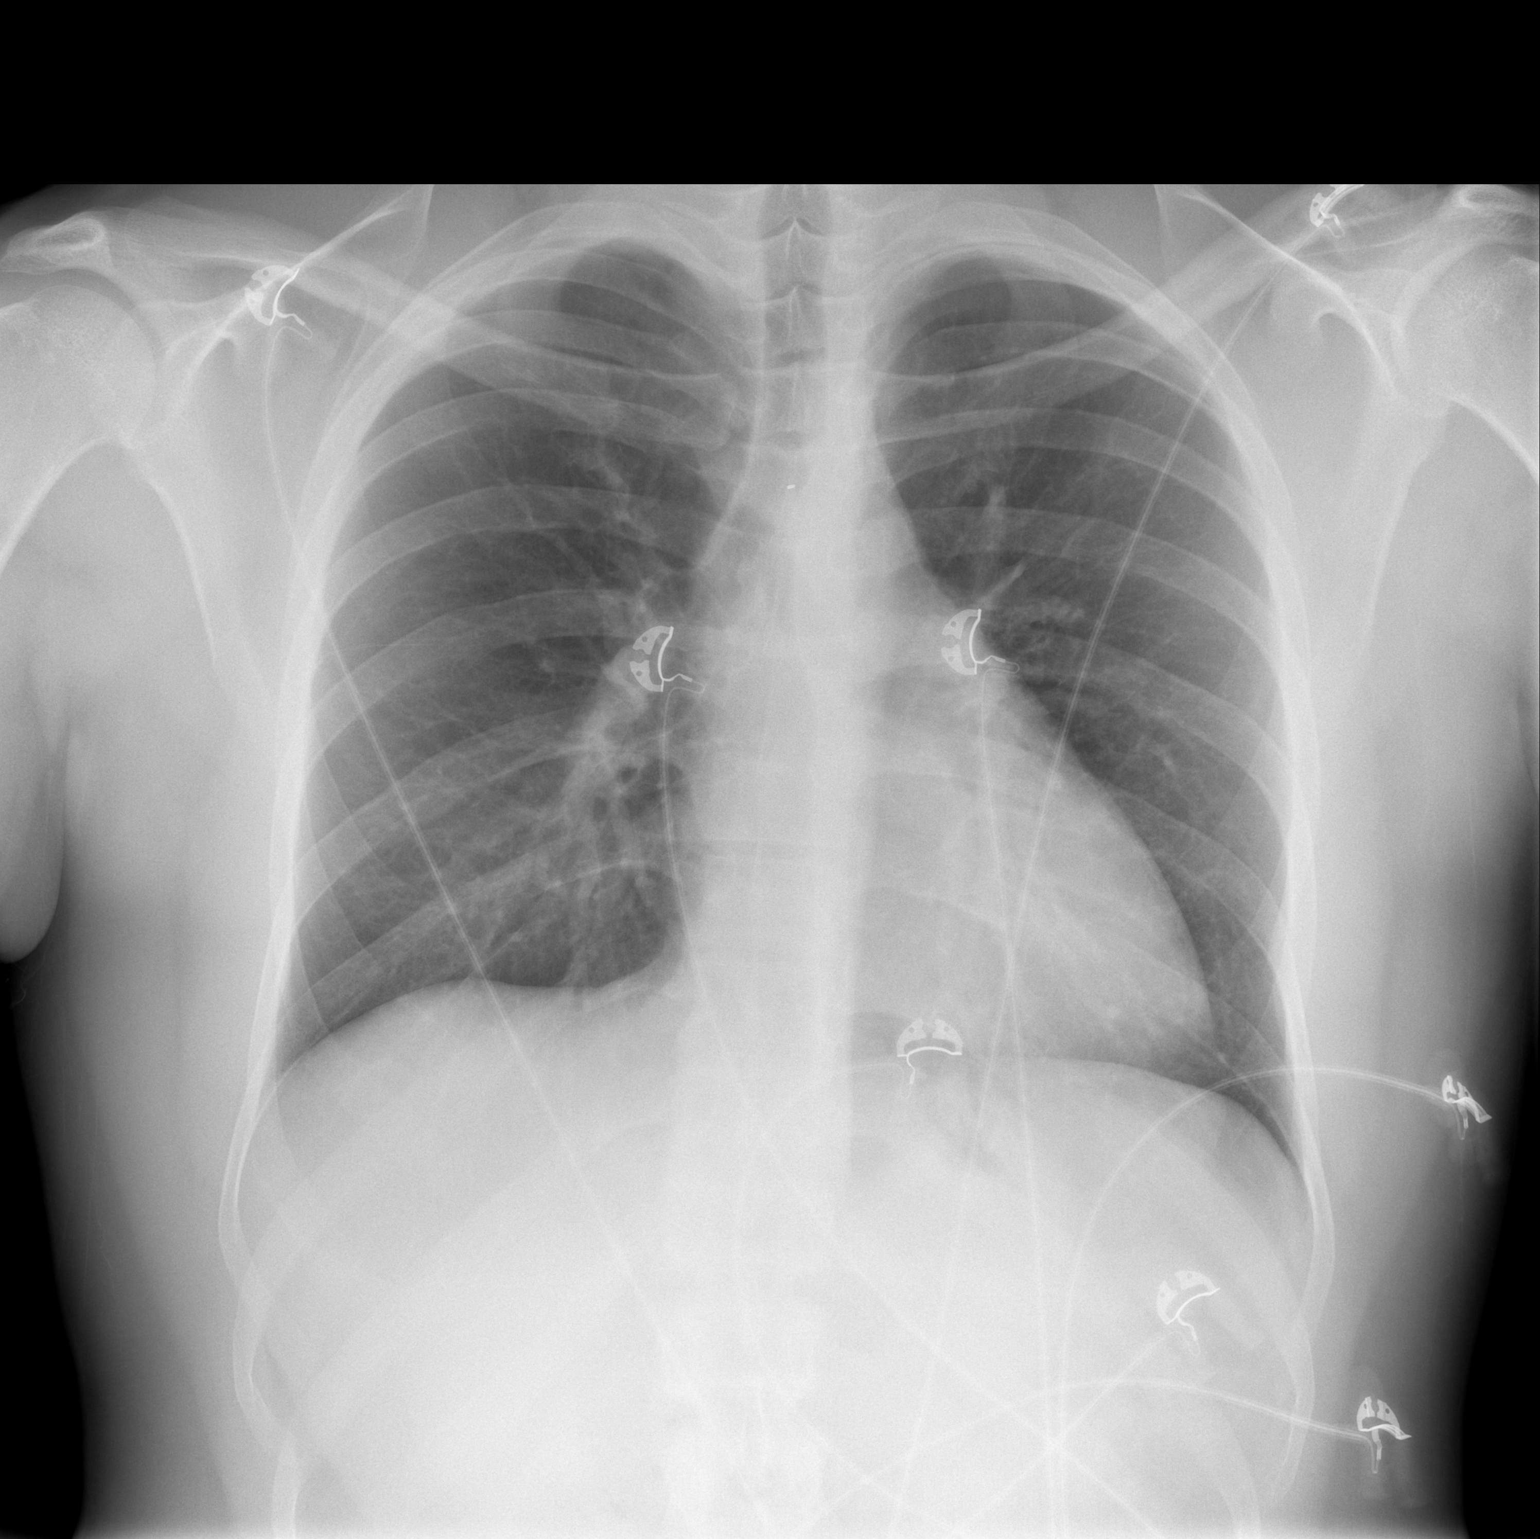

[w chest lat]
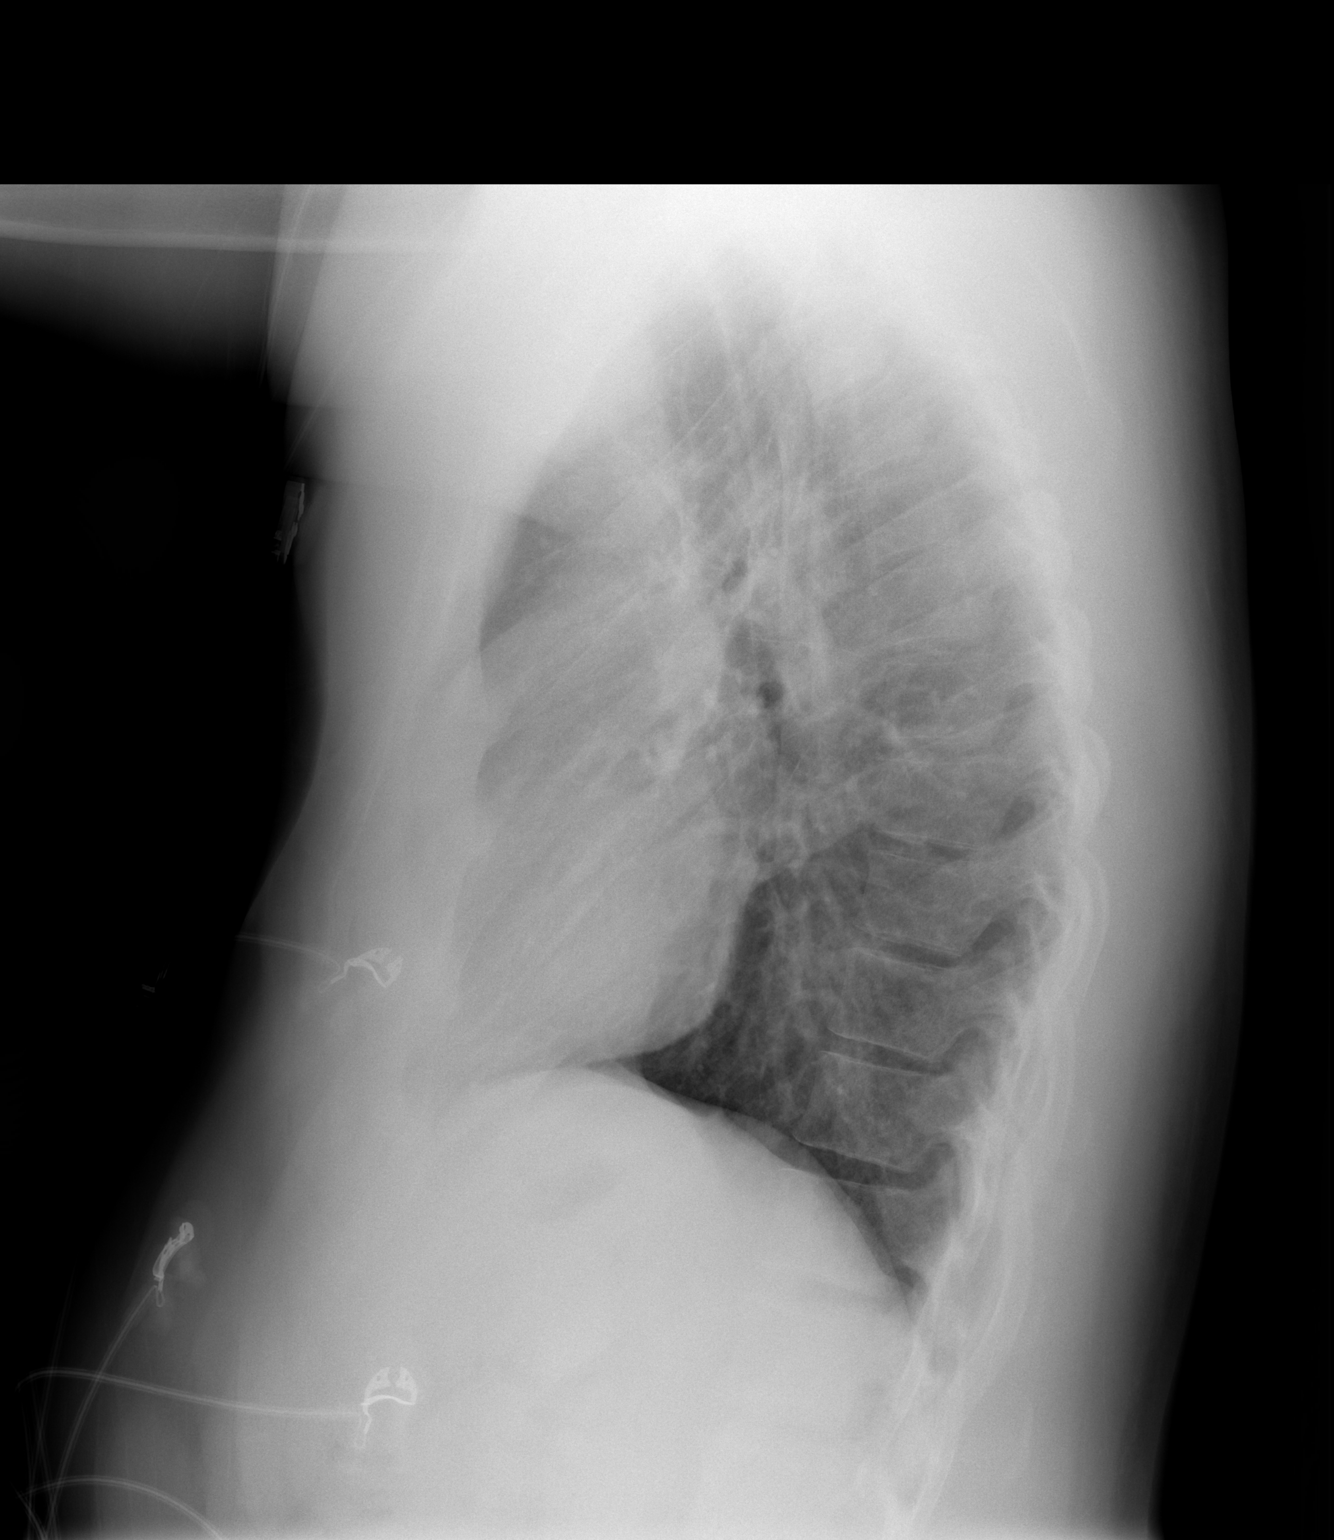

[2 of 2 positions shown; findings below may reference images not displayed]

FINDINGS: The cardiac and mediastinal silhouettes are stable in size and
contour, and remain within normal limits. Subcentimeter metallic
clip overlying the upper mediastinum noted, stable.

The lungs are normally inflated. No airspace consolidation, pleural
effusion, or pulmonary edema is identified. There is no
pneumothorax.

No acute osseous abnormality identified.
IMPRESSION: No active cardiopulmonary disease.

## 2020-04-17 HISTORY — PX: CARDIAC SURGERY: SHX584

## 2022-09-11 ENCOUNTER — Encounter (HOSPITAL_BASED_OUTPATIENT_CLINIC_OR_DEPARTMENT_OTHER): Payer: Self-pay | Admitting: Emergency Medicine

## 2022-09-11 ENCOUNTER — Other Ambulatory Visit: Payer: Self-pay

## 2022-09-11 ENCOUNTER — Emergency Department (HOSPITAL_BASED_OUTPATIENT_CLINIC_OR_DEPARTMENT_OTHER)
Admission: EM | Admit: 2022-09-11 | Discharge: 2022-09-12 | Disposition: A | Discharge status: Home / Self Care | Payer: Self-pay | Attending: Emergency Medicine | Admitting: Emergency Medicine

## 2022-09-11 DIAGNOSIS — R739 Hyperglycemia, unspecified: Secondary | ICD-10-CM

## 2022-09-11 DIAGNOSIS — E876 Hypokalemia: Secondary | ICD-10-CM

## 2022-09-11 DIAGNOSIS — R079 Chest pain, unspecified: Secondary | ICD-10-CM | POA: Insufficient documentation

## 2022-09-11 DIAGNOSIS — Z91010 Allergy to peanuts: Secondary | ICD-10-CM | POA: Insufficient documentation

## 2022-09-11 DIAGNOSIS — R7309 Other abnormal glucose: Secondary | ICD-10-CM | POA: Insufficient documentation

## 2022-09-11 DIAGNOSIS — R519 Headache, unspecified: Secondary | ICD-10-CM | POA: Insufficient documentation

## 2022-09-11 DIAGNOSIS — R110 Nausea: Secondary | ICD-10-CM | POA: Insufficient documentation

## 2022-09-11 DIAGNOSIS — R1310 Dysphagia, unspecified: Secondary | ICD-10-CM
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
## 2022-09-12 ENCOUNTER — Emergency Department (HOSPITAL_BASED_OUTPATIENT_CLINIC_OR_DEPARTMENT_OTHER): Payer: Self-pay

## 2022-09-12 LAB — BASIC METABOLIC PANEL
Anion gap: 10 (ref 5–15)
BUN: 24 mg/dL — ABNORMAL HIGH (ref 6–20)
CO2: 27 mmol/L (ref 22–32)
Calcium: 9.1 mg/dL (ref 8.9–10.3)
Chloride: 100 mmol/L (ref 98–111)
Creatinine, Ser: 1.21 mg/dL (ref 0.61–1.24)
GFR, Estimated: >60 mL/min (ref >60)
Glucose, Bld: 122 mg/dL — ABNORMAL HIGH (ref 70–99)
Potassium: 3.1 mmol/L — ABNORMAL LOW (ref 3.5–5.1)
Sodium: 137 mmol/L (ref 135–145)

## 2022-09-12 LAB — CBC
HCT: 41.8 % (ref 39.0–52.0)
Hemoglobin: 13.6 g/dL (ref 13.0–17.0)
MCH: 28.6 pg (ref 26.0–34.0)
MCHC: 32.5 g/dL (ref 30.0–36.0)
MCV: 88 fL (ref 80.0–100.0)
Platelets: 241 10*3/uL (ref 150–400)
RBC: 4.75 MIL/uL (ref 4.22–5.81)
RDW: 12.8 % (ref 11.5–15.5)
WBC: 7.8 10*3/uL (ref 4.0–10.5)
nRBC: 0 % (ref 0.0–0.2)

## 2022-09-12 LAB — TROPONIN I (HIGH SENSITIVITY): Troponin I (High Sensitivity): 3 ng/L (ref <18)

## 2022-09-12 MED ORDER — FIRST-PANTOPRAZOLE 4 MG/ML PO SUSP: 40.0000 mg | Freq: Every day | ORAL | 0 refills | Status: AC

## 2022-09-12 MED ORDER — IOHEXOL 350 MG/ML SOLN
75.0000 mL | Freq: Once | INTRAVENOUS | Status: AC | PRN
  Administered 2022-09-12: 75 mL via INTRAVENOUS

## 2022-09-12 MED ORDER — ONDANSETRON HCL 4 MG/2ML IJ SOLN
4.0000 mg | Freq: Once | INTRAMUSCULAR | Status: AC
  Administered 2022-09-12: 4 mg via INTRAVENOUS
  Filled 2022-09-12: qty 2

## 2022-09-12 MED ORDER — SUCRALFATE 1 GM/10ML PO SUSP: 1.0000 g | Freq: Three times a day (TID) | ORAL | 0 refills | Status: AC

## 2022-09-12 MED ORDER — SODIUM CHLORIDE 0.9 % IV BOLUS
1000.0000 mL | Freq: Once | INTRAVENOUS | Status: AC
  Administered 2022-09-12: 1000 mL via INTRAVENOUS

## 2022-09-12 NOTE — ED Notes (Signed)
Patient transported to X-ray 

## 2022-09-12 NOTE — Discharge Instructions (Signed)
Return if you are having any problems. 
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
## 2022-09-12 NOTE — ED Notes (Signed)
ED Provider at bedside. 

## 2023-08-13 ENCOUNTER — Encounter (HOSPITAL_BASED_OUTPATIENT_CLINIC_OR_DEPARTMENT_OTHER): Payer: Self-pay | Admitting: Emergency Medicine

## 2023-08-13 ENCOUNTER — Other Ambulatory Visit: Payer: Self-pay

## 2023-08-13 ENCOUNTER — Emergency Department (HOSPITAL_BASED_OUTPATIENT_CLINIC_OR_DEPARTMENT_OTHER): Payer: Self-pay

## 2023-08-13 ENCOUNTER — Emergency Department (HOSPITAL_BASED_OUTPATIENT_CLINIC_OR_DEPARTMENT_OTHER)
Admission: EM | Admit: 2023-08-13 | Discharge: 2023-08-14 | Disposition: A | Discharge status: Home / Self Care | Payer: Self-pay | Attending: Emergency Medicine | Admitting: Emergency Medicine

## 2023-08-13 DIAGNOSIS — Z91010 Allergy to peanuts: Secondary | ICD-10-CM | POA: Insufficient documentation

## 2023-08-13 DIAGNOSIS — R09A2 Foreign body sensation, throat: Secondary | ICD-10-CM | POA: Insufficient documentation

## 2023-08-13 LAB — CBC WITH DIFFERENTIAL/PLATELET
Abs Immature Granulocytes: 0.03 10*3/uL (ref 0.00–0.07)
Basophils Absolute: 0.1 10*3/uL (ref 0.0–0.1)
Basophils Relative: 1 %
Eosinophils Absolute: 0.3 10*3/uL (ref 0.0–0.5)
Eosinophils Relative: 4 %
HCT: 40.6 % (ref 39.0–52.0)
Hemoglobin: 13.1 g/dL (ref 13.0–17.0)
Immature Granulocytes: 0 %
Lymphocytes Relative: 31 %
Lymphs Abs: 2.3 10*3/uL (ref 0.7–4.0)
MCH: 28.3 pg (ref 26.0–34.0)
MCHC: 32.3 g/dL (ref 30.0–36.0)
MCV: 87.7 fL (ref 80.0–100.0)
Monocytes Absolute: 0.6 10*3/uL (ref 0.1–1.0)
Monocytes Relative: 8 %
Neutro Abs: 4.1 10*3/uL (ref 1.7–7.7)
Neutrophils Relative %: 56 %
Platelets: 202 10*3/uL (ref 150–400)
RBC: 4.63 MIL/uL (ref 4.22–5.81)
RDW: 13.1 % (ref 11.5–15.5)
WBC: 7.5 10*3/uL (ref 4.0–10.5)
nRBC: 0 % (ref 0.0–0.2)

## 2023-08-13 LAB — I-STAT CHEM 8, ED
BUN: 21 mg/dL — ABNORMAL HIGH (ref 6–20)
Calcium, Ion: 1.17 mmol/L (ref 1.15–1.40)
Chloride: 103 mmol/L (ref 98–111)
Creatinine, Ser: 1.3 mg/dL — ABNORMAL HIGH (ref 0.61–1.24)
Glucose, Bld: 91 mg/dL (ref 70–99)
HCT: 47 % (ref 39.0–52.0)
Hemoglobin: 16 g/dL (ref 13.0–17.0)
Potassium: 3.8 mmol/L (ref 3.5–5.1)
Sodium: 141 mmol/L (ref 135–145)
TCO2: 28 mmol/L (ref 22–32)

## 2023-08-13 MED ORDER — IOHEXOL 300 MG/ML  SOLN
75.0000 mL | Freq: Once | INTRAMUSCULAR | Status: AC | PRN
  Administered 2023-08-13: 75 mL via INTRAVENOUS

## 2023-08-13 NOTE — ED Triage Notes (Addendum)
 Pt reports eating jelly beans a week ago and had heartburn and felt like something is "stuck in his throat", pt speaking normally in triage, no ShoB noted, no drooling noted; RT called to assess
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
## 2023-08-13 NOTE — ED Provider Notes (Signed)
  Provider Note MRN:  [id number]  Arrival date & time: 08/14/23    ED Course and Medical Decision Making  Assumed care of patient at sign-out or upon transfer.  History of severe trauma and tracheostomy senting with foreign body sensation or globus sensation, awaiting CT.  1 AM update: CT is largely unremarkable, especially in the area of patient's symptoms near the manubrium.  Posterior oropharynx on my exam is unremarkable.  Appropriate for discharge.  Procedures  Final Clinical Impressions(s) / ED Diagnoses     ICD-10-CM   1. Foreign body sensation in throat  R09.A2       ED Discharge Orders     None         Discharge Instructions      You were evaluated in the Emergency Department and after careful evaluation, we did not find any emergent condition requiring admission or further testing in the hospital.  Your exam/testing today is overall reassuring.  Recommend restarting your omeprazole as we discussed.  If symptoms not going away would recommend follow-up with the ear nose and throat specialist.  Please return to the Emergency Department if you experience any worsening of your condition.   Thank you for allowing [place] to be a part of your care.      Elmer Sow. Pilar Plate, MD [facility] Emergency Medicine [facility] [email or web address]    Sabas Sous, MD 08/14/23 [phone]

## 2023-08-13 NOTE — ED Notes (Signed)
   08/13/23 1656  Respiratory Assessment  $ RT Protocol Assessment  Yes  Assessment Type Assess only  Respiratory Pattern Regular;Unlabored;Symmetrical  Chest Assessment Chest expansion symmetrical  Cough Non-productive  Bilateral Breath Sounds Clear  Oxygen Therapy/Pulse Ox  O2 Therapy Room air  SpO2 97 %   No stridor noted BBS grossly clear, no increased work of breathing noted.

## 2023-08-14 MED ORDER — DEXAMETHASONE SODIUM PHOSPHATE 10 MG/ML IJ SOLN
10.0000 mg | Freq: Once | INTRAMUSCULAR | Status: AC
  Administered 2023-08-14: 10 mg via INTRAVENOUS
  Filled 2023-08-14: qty 1

## 2023-08-14 NOTE — Discharge Instructions (Signed)
 You were evaluated in the Emergency Department and after careful evaluation, we did not find any emergent condition requiring admission or further testing in the hospital.  Your exam/testing today is overall reassuring.  Recommend restarting your omeprazole as we discussed.  If symptoms not going away would recommend follow-up with the ear nose and throat specialist.  Please return to the Emergency Department if you experience any worsening of your condition.   Thank you for allowing [place] to be a part of your care.
